# Patient Record
Sex: Female | Born: 1960 | Race: White | Hispanic: No | Marital: Single | Smoking: Never smoker
Health system: Southern US, Community
[De-identification: ages and names within clinical notes are randomized; demographics above are authoritative.]

## PROBLEM LIST (undated history)

## (undated) DIAGNOSIS — Z972 Presence of dental prosthetic device (complete) (partial): Secondary | ICD-10-CM

## (undated) DIAGNOSIS — S82843A Displaced bimalleolar fracture of unspecified lower leg, initial encounter for closed fracture: Secondary | ICD-10-CM

## (undated) DIAGNOSIS — E039 Hypothyroidism, unspecified: Secondary | ICD-10-CM

## (undated) DIAGNOSIS — S82842A Displaced bimalleolar fracture of left lower leg, initial encounter for closed fracture: Secondary | ICD-10-CM

## (undated) DIAGNOSIS — Z98811 Dental restoration status: Secondary | ICD-10-CM

## (undated) DIAGNOSIS — F419 Anxiety disorder, unspecified: Secondary | ICD-10-CM

## (undated) HISTORY — PX: LUMBAR DISC SURGERY: SHX700

---

## 1997-04-12 HISTORY — PX: ORIF CLAVICLE FRACTURE: SUR924

## 1998-03-18 ENCOUNTER — Other Ambulatory Visit: Admission: RE | Admit: 1998-03-18 | Discharge: 1998-03-18 | Payer: Self-pay | Admitting: Obstetrics and Gynecology

## 1999-05-20 ENCOUNTER — Other Ambulatory Visit: Admission: RE | Admit: 1999-05-20 | Discharge: 1999-05-20 | Payer: Self-pay | Admitting: Obstetrics and Gynecology

## 2001-07-04 ENCOUNTER — Other Ambulatory Visit: Admission: RE | Admit: 2001-07-04 | Discharge: 2001-07-04 | Payer: Self-pay | Admitting: Obstetrics and Gynecology

## 2002-09-04 ENCOUNTER — Other Ambulatory Visit: Admission: RE | Admit: 2002-09-04 | Discharge: 2002-09-04 | Payer: Self-pay | Admitting: Obstetrics and Gynecology

## 2003-09-27 ENCOUNTER — Other Ambulatory Visit: Admission: RE | Admit: 2003-09-27 | Discharge: 2003-09-27 | Payer: Self-pay | Admitting: Obstetrics and Gynecology

## 2004-12-23 ENCOUNTER — Other Ambulatory Visit: Admission: RE | Admit: 2004-12-23 | Discharge: 2004-12-23 | Payer: Self-pay | Admitting: Obstetrics and Gynecology

## 2006-01-17 ENCOUNTER — Ambulatory Visit: Payer: Self-pay | Admitting: Oncology

## 2014-06-10 ENCOUNTER — Other Ambulatory Visit: Payer: Self-pay | Admitting: Obstetrics and Gynecology

## 2014-06-10 DIAGNOSIS — R928 Other abnormal and inconclusive findings on diagnostic imaging of breast: Secondary | ICD-10-CM

## 2014-06-17 ENCOUNTER — Ambulatory Visit
Admission: RE | Admit: 2014-06-17 | Discharge: 2014-06-17 | Disposition: A | Discharge status: Home / Self Care | Payer: PRIVATE HEALTH INSURANCE | Source: Ambulatory Visit | Attending: Obstetrics and Gynecology | Admitting: Obstetrics and Gynecology

## 2014-06-17 DIAGNOSIS — R928 Other abnormal and inconclusive findings on diagnostic imaging of breast: Secondary | ICD-10-CM

## 2015-04-17 ENCOUNTER — Other Ambulatory Visit: Payer: Self-pay | Admitting: Orthopedic Surgery

## 2015-04-17 DIAGNOSIS — R079 Chest pain, unspecified: Secondary | ICD-10-CM

## 2015-04-21 ENCOUNTER — Ambulatory Visit
Admission: RE | Admit: 2015-04-21 | Discharge: 2015-04-21 | Disposition: A | Discharge status: Home / Self Care | Payer: PRIVATE HEALTH INSURANCE | Source: Ambulatory Visit | Attending: Orthopedic Surgery | Admitting: Orthopedic Surgery

## 2015-04-21 DIAGNOSIS — R079 Chest pain, unspecified: Secondary | ICD-10-CM

## 2015-04-21 MED ORDER — IOPAMIDOL (ISOVUE-300) INJECTION 61%
75.0000 mL | Freq: Once | INTRAVENOUS | Status: AC | PRN
  Administered 2015-04-21: 75 mL via INTRAVENOUS

## 2015-05-02 HISTORY — PX: CLAVICLE HARDWARE REMOVAL: SHX1351

## 2015-05-09 ENCOUNTER — Encounter (HOSPITAL_BASED_OUTPATIENT_CLINIC_OR_DEPARTMENT_OTHER): Payer: Self-pay | Admitting: Physician Assistant

## 2015-05-09 DIAGNOSIS — F419 Anxiety disorder, unspecified: Secondary | ICD-10-CM | POA: Diagnosis present

## 2015-05-09 DIAGNOSIS — E039 Hypothyroidism, unspecified: Secondary | ICD-10-CM | POA: Diagnosis present

## 2015-05-09 DIAGNOSIS — S42002K Fracture of unspecified part of left clavicle, subsequent encounter for fracture with nonunion: Secondary | ICD-10-CM | POA: Diagnosis present

## 2015-05-09 NOTE — H&P (Signed)
Deborah Sullivan is an 55 y.o. female.   Chief Complaint: left clavicle fracture non union HPI: Patient suffered a clavicle fracture 19 years ago.  It was managed by an outside orthopedist and developed a malunion.  She underwent takedown of the malunion and ORIF by Dr Thurston Hole in 1999.  She did well until 6-9 months ago when she developed a recurring wound over the lateral aspect of the plate.  She came in to see Dr Thurston Hole and January and had hardware removal on 05/02/2015.  She had no surgical complications.  At her post op visit on 05/08/2015 xrays showed displaced clavicle fracture mid shaft.  Patient does not recaull and sensation of the bone fracturing.  Past Medical History  Diagnosis Date  . 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 Hypothyroidism     Past Surgical History  Procedure Laterality Date  . Orif clavicle fracture Left 1999  . Clavicle hardware removal Left 05/02/2015    No family history on file. Social History:  reports that she has never smoked. She does not have any smokeless tobacco history on file. She reports that she does not drink alcohol. Her drug history is not on file.  Allergies: No Known Allergies  No current facility-administered medications for this encounter.  Current outpatient prescriptions:  .  levothyroxine (SYNTHROID, LEVOTHROID) 50 MCG tablet, Take 50 mcg by mouth daily before breakfast., Disp: , Rfl:  .  oxyCODONE-acetaminophen (PERCOCET/ROXICET) 5-325 MG tablet, Take by mouth every 4 (four) hours as needed for severe pain., Disp: , Rfl:  .  sertraline (ZOLOFT) 50 MG tablet, Take 50 mg by mouth daily., Disp: , Rfl:   No results found for this or any previous visit (from the past 48 hour(s)). No results found.  Review of Systems  Constitutional: Negative.   HENT: Negative.   Eyes: Negative.   Respiratory: Negative.   Cardiovascular: Negative.   Gastrointestinal: Negative.   Musculoskeletal:       Left clavicle fracture  Skin:  Negative.   Neurological: Negative.   Endo/Heme/Allergies: Negative.   Psychiatric/Behavioral: Negative.     There were no vitals taken for this visit. Physical Exam  Constitutional: She is oriented to person, place, and time. She appears well-developed and well-nourished.  HENT:  Head: Normocephalic and atraumatic.  Mouth/Throat: Oropharynx is clear and moist.  Eyes: Conjunctivae are normal. Pupils are equal, round, and reactive to light.  Neck: Neck supple.  Cardiovascular: Normal rate.   Respiratory: Effort normal.  GI: Soft.  Genitourinary:  Not pertinent to current symptomatology therefore not examined.  Musculoskeletal:  Left clavicle wound well approximated and healing.  Mild swelling.   No redness  Neurological: She is alert and oriented to person, place, and time.  Skin: Skin is warm and dry.  Psychiatric: She has a normal mood and affect. Her behavior is normal. Thought content normal.     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  Deborah Sullivan J 05/09/2015, 2:10 PM

## 2015-05-13 ENCOUNTER — Encounter (HOSPITAL_BASED_OUTPATIENT_CLINIC_OR_DEPARTMENT_OTHER)
Admission: RE | Disposition: A | Discharge status: Home / Self Care | Payer: Self-pay | Source: Ambulatory Visit | Attending: Orthopedic Surgery

## 2015-05-13 ENCOUNTER — Encounter (HOSPITAL_BASED_OUTPATIENT_CLINIC_OR_DEPARTMENT_OTHER): Payer: Self-pay | Admitting: *Deleted

## 2015-05-13 ENCOUNTER — Ambulatory Visit (HOSPITAL_BASED_OUTPATIENT_CLINIC_OR_DEPARTMENT_OTHER): Payer: PRIVATE HEALTH INSURANCE | Admitting: Anesthesiology

## 2015-05-13 ENCOUNTER — Ambulatory Visit (HOSPITAL_BASED_OUTPATIENT_CLINIC_OR_DEPARTMENT_OTHER)
Admission: RE | Admit: 2015-05-13 | Discharge: 2015-05-13 | Disposition: A | Discharge status: Home / Self Care | Payer: PRIVATE HEALTH INSURANCE | Source: Ambulatory Visit | Attending: Orthopedic Surgery | Admitting: Orthopedic Surgery

## 2015-05-13 DIAGNOSIS — F419 Anxiety disorder, unspecified: Secondary | ICD-10-CM | POA: Diagnosis present

## 2015-05-13 DIAGNOSIS — E039 Hypothyroidism, unspecified: Secondary | ICD-10-CM | POA: Diagnosis not present

## 2015-05-13 DIAGNOSIS — Z79899 Other long term (current) drug therapy: Secondary | ICD-10-CM | POA: Diagnosis not present

## 2015-05-13 DIAGNOSIS — M84412A Pathological fracture, left shoulder, initial encounter for fracture: Secondary | ICD-10-CM | POA: Diagnosis present

## 2015-05-13 DIAGNOSIS — S42002K Fracture of unspecified part of left clavicle, subsequent encounter for fracture with nonunion: Secondary | ICD-10-CM | POA: Diagnosis present

## 2015-05-13 HISTORY — DX: Anxiety disorder, unspecified: F41.9

## 2015-05-13 HISTORY — PX: ORIF CLAVICULAR FRACTURE: SHX5055

## 2015-05-13 HISTORY — DX: Hypothyroidism, unspecified: E03.9

## 2015-05-13 SURGERY — OPEN REDUCTION INTERNAL FIXATION (ORIF) CLAVICULAR FRACTURE
Anesthesia: General | Site: Shoulder | Laterality: Left

## 2015-05-13 MED ORDER — FENTANYL CITRATE (PF) 100 MCG/2ML IJ SOLN
INTRAMUSCULAR | Status: AC
  Filled 2015-05-13: qty 2

## 2015-05-13 MED ORDER — ONDANSETRON HCL 4 MG/2ML IJ SOLN
INTRAMUSCULAR | Status: DC | PRN
  Administered 2015-05-13: 4 mg via INTRAVENOUS

## 2015-05-13 MED ORDER — CEFAZOLIN SODIUM-DEXTROSE 2-3 GM-% IV SOLR
2.0000 g | INTRAVENOUS | Status: AC
  Administered 2015-05-13: 2 g via INTRAVENOUS

## 2015-05-13 MED ORDER — MIDAZOLAM HCL 2 MG/2ML IJ SOLN
INTRAMUSCULAR | Status: AC
  Filled 2015-05-13: qty 2

## 2015-05-13 MED ORDER — PROPOFOL 10 MG/ML IV BOLUS
INTRAVENOUS | Status: AC
  Filled 2015-05-13: qty 20

## 2015-05-13 MED ORDER — ONDANSETRON HCL 4 MG/2ML IJ SOLN
INTRAMUSCULAR | Status: AC
  Filled 2015-05-13: qty 2

## 2015-05-13 MED ORDER — CEFAZOLIN SODIUM-DEXTROSE 2-3 GM-% IV SOLR
INTRAVENOUS | Status: AC
  Filled 2015-05-13: qty 50

## 2015-05-13 MED ORDER — HYDROMORPHONE HCL 1 MG/ML IJ SOLN
INTRAMUSCULAR | Status: AC
  Filled 2015-05-13: qty 1

## 2015-05-13 MED ORDER — OXYCODONE-ACETAMINOPHEN 5-325 MG PO TABS: 1.0000 | ORAL_TABLET | ORAL | Status: DC | PRN

## 2015-05-13 MED ORDER — LACTATED RINGERS IV SOLN
INTRAVENOUS | Status: DC
  Administered 2015-05-13 (×3): via INTRAVENOUS

## 2015-05-13 MED ORDER — PROPOFOL 10 MG/ML IV BOLUS
INTRAVENOUS | Status: DC | PRN
  Administered 2015-05-13: 40 mg via INTRAVENOUS
  Administered 2015-05-13: 50 mg via INTRAVENOUS
  Administered 2015-05-13: 150 mg via INTRAVENOUS

## 2015-05-13 MED ORDER — SCOPOLAMINE 1 MG/3DAYS TD PT72: 1.0000 | MEDICATED_PATCH | Freq: Once | TRANSDERMAL | Status: DC | PRN

## 2015-05-13 MED ORDER — FENTANYL CITRATE (PF) 100 MCG/2ML IJ SOLN
50.0000 ug | INTRAMUSCULAR | Status: AC | PRN
  Administered 2015-05-13: 25 ug via INTRAVENOUS
  Administered 2015-05-13: 100 ug via INTRAVENOUS
  Administered 2015-05-13: 25 ug via INTRAVENOUS
  Administered 2015-05-13: 50 ug via INTRAVENOUS

## 2015-05-13 MED ORDER — DEXAMETHASONE SODIUM PHOSPHATE 10 MG/ML IJ SOLN
INTRAMUSCULAR | Status: AC
  Filled 2015-05-13: qty 1

## 2015-05-13 MED ORDER — SUCCINYLCHOLINE CHLORIDE 20 MG/ML IJ SOLN
INTRAMUSCULAR | Status: AC
  Filled 2015-05-13: qty 1

## 2015-05-13 MED ORDER — PROMETHAZINE HCL 25 MG/ML IJ SOLN: 6.2500 mg | INTRAMUSCULAR | Status: DC | PRN

## 2015-05-13 MED ORDER — DEXAMETHASONE SODIUM PHOSPHATE 4 MG/ML IJ SOLN
INTRAMUSCULAR | Status: DC | PRN
  Administered 2015-05-13: 10 mg via INTRAVENOUS

## 2015-05-13 MED ORDER — HYDROMORPHONE HCL 1 MG/ML IJ SOLN
0.2500 mg | INTRAMUSCULAR | Status: DC | PRN
  Administered 2015-05-13 (×4): 0.5 mg via INTRAVENOUS

## 2015-05-13 MED ORDER — LACTATED RINGERS IV SOLN: INTRAVENOUS | Status: DC

## 2015-05-13 MED ORDER — GLYCOPYRROLATE 0.2 MG/ML IJ SOLN
0.2000 mg | Freq: Once | INTRAMUSCULAR | Status: AC | PRN
  Administered 2015-05-13: 0.2 mg via INTRAVENOUS

## 2015-05-13 MED ORDER — SUCCINYLCHOLINE CHLORIDE 20 MG/ML IJ SOLN
INTRAMUSCULAR | Status: DC | PRN
  Administered 2015-05-13: 100 mg via INTRAVENOUS

## 2015-05-13 MED ORDER — MEPERIDINE HCL 25 MG/ML IJ SOLN: 6.2500 mg | INTRAMUSCULAR | Status: DC | PRN

## 2015-05-13 MED ORDER — OXYCODONE HCL 5 MG PO TABS
5.0000 mg | ORAL_TABLET | Freq: Once | ORAL | Status: AC | PRN
  Administered 2015-05-13: 5 mg via ORAL

## 2015-05-13 MED ORDER — LIDOCAINE HCL (CARDIAC) 20 MG/ML IV SOLN
INTRAVENOUS | Status: AC
  Filled 2015-05-13: qty 5

## 2015-05-13 MED ORDER — OXYCODONE HCL 5 MG PO TABS
ORAL_TABLET | ORAL | Status: AC
  Filled 2015-05-13: qty 1

## 2015-05-13 MED ORDER — MIDAZOLAM HCL 2 MG/2ML IJ SOLN
1.0000 mg | INTRAMUSCULAR | Status: DC | PRN
  Administered 2015-05-13: 2 mg via INTRAVENOUS

## 2015-05-13 MED ORDER — LIDOCAINE HCL (CARDIAC) 20 MG/ML IV SOLN
INTRAVENOUS | Status: DC | PRN
  Administered 2015-05-13: 50 mg via INTRAVENOUS

## 2015-05-13 MED ORDER — FENTANYL CITRATE (PF) 100 MCG/2ML IJ SOLN
INTRAMUSCULAR | Status: AC
Start: 2015-05-13 — End: 2015-05-13
  Filled 2015-05-13: qty 2

## 2015-05-13 MED ORDER — CHLORHEXIDINE GLUCONATE 4 % EX LIQD: 60.0000 mL | Freq: Once | CUTANEOUS | Status: DC

## 2015-05-13 SURGICAL SUPPLY — 80 items
BENZOIN TINCTURE PRP APPL 2/3 (GAUZE/BANDAGES/DRESSINGS) ×3 IMPLANT
BIT DRILL 2.8X5 QR DISP (BIT) ×3 IMPLANT
BLADE HEX COATED 2.75 (ELECTRODE) ×3 IMPLANT
BLADE SURG 15 STRL LF DISP TIS (BLADE) ×2 IMPLANT
BLADE SURG 15 STRL SS (BLADE) ×4
BNDG COHESIVE 4X5 TAN STRL (GAUZE/BANDAGES/DRESSINGS) ×3 IMPLANT
CANISTER SUCT 1200ML W/VALVE (MISCELLANEOUS) ×3 IMPLANT
CLOSURE WOUND 1/2 X4 (GAUZE/BANDAGES/DRESSINGS) ×1
COVER BACK TABLE 60X90IN (DRAPES) ×3 IMPLANT
COVER MAYO STAND STRL (DRAPES) ×3 IMPLANT
DECANTER SPIKE VIAL GLASS SM (MISCELLANEOUS) IMPLANT
DRAPE INCISE IOBAN 66X45 STRL (DRAPES) IMPLANT
DRAPE OEC MINIVIEW 54X84 (DRAPES) ×3 IMPLANT
DRAPE SHOULDER BEACH CHAIR (DRAPES) IMPLANT
DRAPE SURG 17X23 STRL (DRAPES) IMPLANT
DRAPE U 20/CS (DRAPES) ×6 IMPLANT
DRAPE U-SHAPE 47X51 STRL (DRAPES) ×6 IMPLANT
DRAPE U-SHAPE 76X120 STRL (DRAPES) ×6 IMPLANT
DRSG PAD ABDOMINAL 8X10 ST (GAUZE/BANDAGES/DRESSINGS) ×3 IMPLANT
DURAPREP 26ML APPLICATOR (WOUND CARE) ×3 IMPLANT
ELECT REM PT RETURN 9FT ADLT (ELECTROSURGICAL) ×3
ELECTRODE REM PT RTRN 9FT ADLT (ELECTROSURGICAL) ×1 IMPLANT
GAUZE SPONGE 4X4 12PLY STRL (GAUZE/BANDAGES/DRESSINGS) ×3 IMPLANT
GAUZE SPONGE 4X4 16PLY XRAY LF (GAUZE/BANDAGES/DRESSINGS) IMPLANT
GAUZE XEROFORM 1X8 LF (GAUZE/BANDAGES/DRESSINGS) IMPLANT
GLOVE BIO SURGEON STRL SZ 6.5 (GLOVE) ×2 IMPLANT
GLOVE BIO SURGEON STRL SZ7 (GLOVE) ×3 IMPLANT
GLOVE BIO SURGEONS STRL SZ 6.5 (GLOVE) ×1
GLOVE BIOGEL PI IND STRL 7.0 (GLOVE) ×3 IMPLANT
GLOVE BIOGEL PI IND STRL 7.5 (GLOVE) ×1 IMPLANT
GLOVE BIOGEL PI IND STRL 8 (GLOVE) ×1 IMPLANT
GLOVE BIOGEL PI INDICATOR 7.0 (GLOVE) ×6
GLOVE BIOGEL PI INDICATOR 7.5 (GLOVE) ×2
GLOVE BIOGEL PI INDICATOR 8 (GLOVE) ×2
GLOVE SS BIOGEL STRL SZ 7.5 (GLOVE) ×1 IMPLANT
GLOVE SS BIOGEL STRL SZ 8 (GLOVE) ×1 IMPLANT
GLOVE SUPERSENSE BIOGEL SZ 7.5 (GLOVE) ×2
GLOVE SUPERSENSE BIOGEL SZ 8 (GLOVE) ×2
GOWN STRL REUS W/ TWL LRG LVL3 (GOWN DISPOSABLE) ×3 IMPLANT
GOWN STRL REUS W/ TWL XL LVL3 (GOWN DISPOSABLE) ×1 IMPLANT
GOWN STRL REUS W/TWL LRG LVL3 (GOWN DISPOSABLE) ×6
GOWN STRL REUS W/TWL XL LVL3 (GOWN DISPOSABLE) ×2
IMPLANT MINI IGNITE KIT 4CC (Tissue) ×3 IMPLANT
NS IRRIG 1000ML POUR BTL (IV SOLUTION) ×3 IMPLANT
PACK BASIN DAY SURGERY FS (CUSTOM PROCEDURE TRAY) ×3 IMPLANT
PENCIL BUTTON HOLSTER BLD 10FT (ELECTRODE) ×3 IMPLANT
PLATE LOCKING 8H LEFT (Plate) ×3 IMPLANT
SCREW HEXALOBE LOCKING 3.5X14M (Screw) ×3 IMPLANT
SCREW HEXALOBE NON-LOCK 3.5X14 (Screw) ×3 IMPLANT
SCREW LOCK 12X3.5X HEXALOBE (Screw) ×2 IMPLANT
SCREW LOCKING 3.5X12 (Screw) ×4 IMPLANT
SCREW NON LOCK 3.5X10MM (Screw) ×3 IMPLANT
SCREW NONLOCK HEX 3.5X12 (Screw) ×6 IMPLANT
SHEET MEDIUM DRAPE 40X70 STRL (DRAPES) ×3 IMPLANT
SLEEVE SCD COMPRESS KNEE MED (MISCELLANEOUS) IMPLANT
SLING ARM FOAM STRAP LRG (SOFTGOODS) ×3 IMPLANT
SLING ARM IMMOBILIZER LRG (SOFTGOODS) IMPLANT
SLING ARM IMMOBILIZER MED (SOFTGOODS) IMPLANT
SLING ARM XL FOAM STRAP (SOFTGOODS) IMPLANT
SPONGE LAP 18X18 X RAY DECT (DISPOSABLE) IMPLANT
SPONGE LAP 4X18 X RAY DECT (DISPOSABLE) ×6 IMPLANT
STAPLER VISISTAT 35W (STAPLE) IMPLANT
STOCKINETTE IMPERVIOUS LG (DRAPES) ×3 IMPLANT
STRIP CLOSURE SKIN 1/2X4 (GAUZE/BANDAGES/DRESSINGS) ×2 IMPLANT
SUCTION FRAZIER HANDLE 10FR (MISCELLANEOUS) ×2
SUCTION TUBE FRAZIER 10FR DISP (MISCELLANEOUS) ×1 IMPLANT
SUT MNCRL AB 3-0 PS2 18 (SUTURE) ×3 IMPLANT
SUT PROLENE 3 0 PS 2 (SUTURE) IMPLANT
SUT SILK 4 0 TIES 17X18 (SUTURE) IMPLANT
SUT VIC AB 0 CT1 27 (SUTURE)
SUT VIC AB 0 CT1 27XBRD ANBCTR (SUTURE) IMPLANT
SUT VIC AB 2-0 PS2 27 (SUTURE) ×3 IMPLANT
SUT VIC AB 2-0 SH 27 (SUTURE)
SUT VIC AB 2-0 SH 27XBRD (SUTURE) IMPLANT
SUT VICRYL 0 UR6 27IN ABS (SUTURE) ×3 IMPLANT
SYR BULB 3OZ (MISCELLANEOUS) ×3 IMPLANT
TUBE CONNECTING 20'X1/4 (TUBING) ×1
TUBE CONNECTING 20X1/4 (TUBING) ×2 IMPLANT
UNDERPAD 30X30 (UNDERPADS AND DIAPERS) IMPLANT
YANKAUER SUCT BULB TIP NO VENT (SUCTIONS) ×3 IMPLANT

## 2015-05-13 NOTE — Anesthesia Postprocedure Evaluation (Signed)
Anesthesia Post Note  Patient: Deborah Sullivan  Procedure(s) Performed: Procedure(s) (LRB): OPEN REDUCTION INTERNAL FIXATION (ORIF) LEFT CLAVICULAR WITH INFUSE BONE GRAFT (Left)  Patient location during evaluation: PACU Anesthesia Type: General Level of consciousness: awake and alert Pain management: pain level controlled Vital Signs Assessment: post-procedure vital signs reviewed and stable Respiratory status: spontaneous breathing, nonlabored ventilation, respiratory function stable and patient connected to nasal cannula oxygen Cardiovascular status: blood pressure returned to baseline and stable Postop Assessment: no signs of nausea or vomiting Anesthetic complications: no    Last Vitals:  Filed Vitals:   05/13/15 1500 05/13/15 1515  BP: 140/91 129/83  Pulse: 60 75  Temp:    Resp: 9 13    Last Pain:  Filed Vitals:   05/13/15 1537  PainSc: 2                  Shelton Silvas

## 2015-05-13 NOTE — Transfer of Care (Signed)
Immediate Anesthesia Transfer of Care Note  Patient: Deborah Sullivan  Procedure(s) Performed: Procedure(s): OPEN REDUCTION INTERNAL FIXATION (ORIF) LEFT CLAVICULAR WITH INFUSE BONE GRAFT (Left)  Patient Location: PACU  Anesthesia Type:General  Level of Consciousness: awake, alert  and oriented  Airway & Oxygen Therapy: Patient Spontanous Breathing and Patient connected to face mask oxygen  Post-op Assessment: Report given to RN and Post -op Vital signs reviewed and stable  Post vital signs: Reviewed and stable  Last Vitals:  Filed Vitals:   05/13/15 1035  BP: 106/73  Pulse: 55  Temp: 36.6 C  Resp: 16    Complications: No apparent anesthesia complications

## 2015-05-13 NOTE — Anesthesia Preprocedure Evaluation (Addendum)
Anesthesia Evaluation  Patient identified by MRN, date of birth, ID band Patient awake    Reviewed: Allergy & Precautions, NPO status , Patient's Chart, lab work & pertinent test results  Airway Mallampati: I  TM Distance: >3 FB Neck ROM: Full    Dental  (+) Teeth Intact   Pulmonary neg pulmonary ROS,    breath sounds clear to auscultation       Cardiovascular negative cardio ROS   Rhythm:Regular Rate:Normal     Neuro/Psych PSYCHIATRIC DISORDERS Anxiety negative neurological ROS     GI/Hepatic negative GI ROS, Neg liver ROS,   Endo/Other  Hypothyroidism   Renal/GU negative Renal ROS  negative genitourinary   Musculoskeletal negative musculoskeletal ROS (+)   Abdominal   Peds negative pediatric ROS (+)  Hematology negative hematology ROS (+)   Anesthesia Other Findings   Reproductive/Obstetrics negative OB ROS                             Anesthesia Physical Anesthesia Plan  ASA: II  Anesthesia Plan: General   Post-op Pain Management:    Induction: Intravenous  Airway Management Planned: Oral ETT  Additional Equipment:   Intra-op Plan:   Post-operative Plan: Extubation in OR  Informed Consent: I have reviewed the patients History and Physical, chart, labs and discussed the procedure including the risks, benefits and alternatives for the proposed anesthesia with the patient or authorized representative who has indicated his/her understanding and acceptance.   Dental advisory given  Plan Discussed with: CRNA  Anesthesia Plan Comments:        Anesthesia Quick Evaluation

## 2015-05-13 NOTE — Op Note (Signed)
NAME:  Deborah Sullivan, Deborah Sullivan                  ACCOUNT NO.:  [id number]  MEDICAL RECORD NO.:  [id number]  LOCATION:                                 FACILITY:  PHYSICIAN:  Elana Alm. Thurston Hole, M.D. DATE OF BIRTH:  [birth year]  DATE OF PROCEDURE:  05/13/2015 DATE OF DISCHARGE:                              OPERATIVE REPORT   PREOPERATIVE DIAGNOSIS:  Left clavicle acute nontraumatic mid shaft displaced fracture 2 weeks status post hardware removal, left clavicle.  POSTOPERATIVE DIAGNOSIS:  Left clavicle acute nontraumatic mid shaft displaced fracture 2 weeks status post hardware removal, left clavicle.  PROCEDURE: 1. Open reduction and internal fixation of left clavicle fracture with     Acumed 8 hole plate. 2. Left clavicle bone grafting using a 9 Ignite bone graft.  SURGEON:  Elana Alm. Thurston Hole, M.D.  ASSISTANTS:  Myrene Galas, M.D. and Julien Girt, [place].  ANESTHESIA:  General.  OPERATIVE TIME:  1 hour.  COMPLICATIONS:  None.  INDICATION FOR PROCEDURE:  Deborah Sullivan is a 55 year old woman whom we had removed a painful hardware from her left clavicle 2 weeks ago.  That hardware had been in place for 20 years.  At the time of that hardware removal, there was no sign of a fracture, but on her first postoperative visit, she came in with a midshaft clavicle fracture and she is now to undergo primary open reduction and internal fixation of this with bone grafting.  DESCRIPTION:  Deborah Sullivan was brought to the operating room on May 13, 2015, placed on operating table in supine position.  After being placed under general anesthesia, her left shoulder and arm were prepped  when she was placed in a beach-chair position using sterile DuraPrep and draped using sterile technique.  A time-out procedure was called and the correct left clavicle identified.  She received antibiotics preoperatively for prophylaxis.  Through her previous incision, a new 6- cm incision was made.  Underlying subcutaneous  tissues were incised along with skin incision.  The previously placed suture material was removed, and the fracture was easily identified in the midshaft.  It did appear that this fracture had probably propagated through a previous screw hole.  No signs of obvious infection, however, we did culture the tissues.  After this was done, the wound was copiously irrigated. Neurovascular structures carefully protected while the fracture was reduced, and then an 8-hole Acumed superior plate was then placed.  The 4 most lateral screw holes were drilled, measured, tapped, and the appropriate length screws placed with 2 of them being locking screws and 2 being nonlocking screws.  The 3 most medial screw holes were drilled, measured, tapped with 1 piece of nonlocking screw and 2 being locking screws of appropriate length.  This gave excellent compression to the fracture and excellent anatomic reduction.  Intraoperative fluoroscopy confirmed anatomic position of the fracture and satisfactory position of the hardware.  At this point, the wound was irrigated and then the 4 mL of the Ignite bone graft which had been mixed with her own blood was then placed in the fracture site.  After this was done, then the deep fascia was closed with interrupted 2-0 Vicryl  suture over the plate and the clavicle and the bone graft.  Subcutaneous tissues closed with 2-0 Vicryl, subcuticular layer closed with 4-0 Monocryl.  Sterile dressings were applied.  Then, the patient awakened and taken to recovery room in stable condition.  Needle, sponge counts correct x2 at the end of the case.  FOLLOWUP CARE:  Deborah Sullivan to be followed as an outpatient on oxycodone for pain.  She will be back in the office in a week for wound check and followup.     Mysti Haley A. Thurston Hole, M.D.     RAW/MEDQ  D:  05/13/2015  T:  05/13/2015  Job:  [id number]

## 2015-05-13 NOTE — Anesthesia Procedure Notes (Signed)
Procedure Name: Intubation Date/Time: 05/13/2015 12:36 PM Performed by: Gar Gibbon Pre-anesthesia Checklist: Patient identified, Emergency Drugs available, Suction available and Patient being monitored Patient Re-evaluated:Patient Re-evaluated prior to inductionOxygen Delivery Method: Circle System Utilized Preoxygenation: Pre-oxygenation with 100% oxygen Intubation Type: IV induction Ventilation: Mask ventilation without difficulty Laryngoscope Size: Mac and 3 Grade View: Grade I Tube type: Oral Tube size: 7.0 mm Number of attempts: 1 Airway Equipment and Method: Stylet and Oral airway Placement Confirmation: ETT inserted through vocal cords under direct vision,  positive ETCO2 and breath sounds checked- equal and bilateral Secured at: 20 cm Tube secured with: Tape Dental Injury: Teeth and Oropharynx as per pre-operative assessment

## 2015-05-13 NOTE — Discharge Instructions (Signed)

## 2015-05-13 NOTE — Interval H&P Note (Signed)
History and Physical Interval Note:  05/13/2015 7:41 AM  Deborah Sullivan  has presented today for surgery, with the diagnosis of FRACTURE OF UNSPECIFIED PART OF UNSPECIFIED CLAVICLE,INITAL ENCOUNTER FOR CLOSED FRACTURE   The various methods of treatment have been discussed with the patient and family. After consideration of risks, benefits and other options for treatment, the patient has consented to  Have open reduction internal fixation of her clavicle with bone graft as a surgical intervention .  The patient's history has been reviewed, patient examined, no change in status, stable for surgery.  I have reviewed the patient's chart and labs.  Questions were answered to the patient's satisfaction.     Salvatore Marvel A

## 2015-05-14 ENCOUNTER — Encounter (HOSPITAL_BASED_OUTPATIENT_CLINIC_OR_DEPARTMENT_OTHER): Payer: Self-pay | Admitting: Orthopedic Surgery

## 2015-05-16 LAB — WOUND CULTURE
CULTURE: NO GROWTH
GRAM STAIN: NONE SEEN

## 2015-05-18 LAB — ANAEROBIC CULTURE: Gram Stain: NONE SEEN

## 2017-04-29 ENCOUNTER — Other Ambulatory Visit: Payer: Self-pay

## 2017-04-29 ENCOUNTER — Encounter (HOSPITAL_BASED_OUTPATIENT_CLINIC_OR_DEPARTMENT_OTHER): Payer: Self-pay | Admitting: *Deleted

## 2017-04-29 ENCOUNTER — Other Ambulatory Visit: Payer: Self-pay | Admitting: Orthopedic Surgery

## 2017-04-29 DIAGNOSIS — S82843A Displaced bimalleolar fracture of unspecified lower leg, initial encounter for closed fracture: Secondary | ICD-10-CM

## 2017-04-29 HISTORY — DX: Displaced bimalleolar fracture of unspecified lower leg, initial encounter for closed fracture: S82.843A

## 2017-05-03 ENCOUNTER — Ambulatory Visit (HOSPITAL_BASED_OUTPATIENT_CLINIC_OR_DEPARTMENT_OTHER): Payer: BLUE CROSS/BLUE SHIELD | Admitting: Anesthesiology

## 2017-05-03 ENCOUNTER — Ambulatory Visit (HOSPITAL_BASED_OUTPATIENT_CLINIC_OR_DEPARTMENT_OTHER)
Admission: RE | Admit: 2017-05-03 | Discharge: 2017-05-03 | Disposition: A | Discharge status: Home / Self Care | Payer: BLUE CROSS/BLUE SHIELD | Source: Ambulatory Visit | Attending: Orthopedic Surgery | Admitting: Orthopedic Surgery

## 2017-05-03 ENCOUNTER — Encounter (HOSPITAL_BASED_OUTPATIENT_CLINIC_OR_DEPARTMENT_OTHER)
Admission: RE | Disposition: A | Discharge status: Home / Self Care | Payer: Self-pay | Source: Ambulatory Visit | Attending: Orthopedic Surgery

## 2017-05-03 ENCOUNTER — Other Ambulatory Visit: Payer: Self-pay

## 2017-05-03 ENCOUNTER — Encounter (HOSPITAL_BASED_OUTPATIENT_CLINIC_OR_DEPARTMENT_OTHER): Payer: Self-pay

## 2017-05-03 DIAGNOSIS — X58XXXA Exposure to other specified factors, initial encounter: Secondary | ICD-10-CM | POA: Diagnosis not present

## 2017-05-03 DIAGNOSIS — E039 Hypothyroidism, unspecified: Secondary | ICD-10-CM | POA: Insufficient documentation

## 2017-05-03 DIAGNOSIS — F419 Anxiety disorder, unspecified: Secondary | ICD-10-CM | POA: Diagnosis not present

## 2017-05-03 DIAGNOSIS — Z79890 Hormone replacement therapy (postmenopausal): Secondary | ICD-10-CM | POA: Insufficient documentation

## 2017-05-03 DIAGNOSIS — S82842A Displaced bimalleolar fracture of left lower leg, initial encounter for closed fracture: Secondary | ICD-10-CM | POA: Diagnosis present

## 2017-05-03 DIAGNOSIS — Z79899 Other long term (current) drug therapy: Secondary | ICD-10-CM | POA: Insufficient documentation

## 2017-05-03 HISTORY — DX: Displaced bimalleolar fracture of left lower leg, initial encounter for closed fracture: S82.842A

## 2017-05-03 HISTORY — DX: Displaced bimalleolar fracture of unspecified lower leg, initial encounter for closed fracture: S82.843A

## 2017-05-03 HISTORY — DX: Presence of dental prosthetic device (complete) (partial): Z97.2

## 2017-05-03 HISTORY — PX: ORIF ANKLE FRACTURE: SHX5408

## 2017-05-03 HISTORY — DX: Dental restoration status: Z98.811

## 2017-05-03 SURGERY — OPEN REDUCTION INTERNAL FIXATION (ORIF) ANKLE FRACTURE
Anesthesia: General | Site: Ankle | Laterality: Left

## 2017-05-03 MED ORDER — DEXAMETHASONE SODIUM PHOSPHATE 10 MG/ML IJ SOLN
INTRAMUSCULAR | Status: AC
  Filled 2017-05-03: qty 1

## 2017-05-03 MED ORDER — 0.9 % SODIUM CHLORIDE (POUR BTL) OPTIME
TOPICAL | Status: DC | PRN
  Administered 2017-05-03: 1000 mL

## 2017-05-03 MED ORDER — CEFAZOLIN SODIUM-DEXTROSE 2-4 GM/100ML-% IV SOLN
2.0000 g | INTRAVENOUS | Status: AC
  Administered 2017-05-03: 2 g via INTRAVENOUS

## 2017-05-03 MED ORDER — GLYCOPYRROLATE 0.2 MG/ML IJ SOLN
INTRAMUSCULAR | Status: DC | PRN
  Administered 2017-05-03: 0.1 mg via INTRAVENOUS

## 2017-05-03 MED ORDER — PROPOFOL 10 MG/ML IV BOLUS
INTRAVENOUS | Status: AC
  Filled 2017-05-03: qty 20

## 2017-05-03 MED ORDER — ONDANSETRON HCL 4 MG PO TABS: 4.0000 mg | ORAL_TABLET | Freq: Three times a day (TID) | ORAL | 0 refills | Status: AC | PRN

## 2017-05-03 MED ORDER — PROPOFOL 10 MG/ML IV BOLUS
INTRAVENOUS | Status: DC | PRN
  Administered 2017-05-03: 150 mg via INTRAVENOUS
  Administered 2017-05-03 (×2): 50 mg via INTRAVENOUS

## 2017-05-03 MED ORDER — LIDOCAINE HCL (CARDIAC) 20 MG/ML IV SOLN
INTRAVENOUS | Status: DC | PRN
  Administered 2017-05-03: 30 mg via INTRAVENOUS

## 2017-05-03 MED ORDER — SENNA-DOCUSATE SODIUM 8.6-50 MG PO TABS: 2.0000 | ORAL_TABLET | Freq: Every day | ORAL | 1 refills | Status: AC

## 2017-05-03 MED ORDER — MIDAZOLAM HCL 2 MG/2ML IJ SOLN
INTRAMUSCULAR | Status: AC
  Filled 2017-05-03: qty 2

## 2017-05-03 MED ORDER — DEXAMETHASONE SODIUM PHOSPHATE 4 MG/ML IJ SOLN
INTRAMUSCULAR | Status: DC | PRN
  Administered 2017-05-03: 10 mg via INTRAVENOUS

## 2017-05-03 MED ORDER — MEPERIDINE HCL 25 MG/ML IJ SOLN: 6.2500 mg | INTRAMUSCULAR | Status: DC | PRN

## 2017-05-03 MED ORDER — PROPOFOL 500 MG/50ML IV EMUL
INTRAVENOUS | Status: AC
  Filled 2017-05-03: qty 50

## 2017-05-03 MED ORDER — ONDANSETRON HCL 4 MG/2ML IJ SOLN
INTRAMUSCULAR | Status: DC | PRN
  Administered 2017-05-03: 4 mg via INTRAVENOUS

## 2017-05-03 MED ORDER — ROPIVACAINE HCL 7.5 MG/ML IJ SOLN
INTRAMUSCULAR | Status: DC | PRN
  Administered 2017-05-03: 20 mL via PERINEURAL

## 2017-05-03 MED ORDER — LIDOCAINE 2% (20 MG/ML) 5 ML SYRINGE
INTRAMUSCULAR | Status: AC
  Filled 2017-05-03: qty 5

## 2017-05-03 MED ORDER — FENTANYL CITRATE (PF) 100 MCG/2ML IJ SOLN
INTRAMUSCULAR | Status: DC | PRN
  Administered 2017-05-03: 100 ug via INTRAVENOUS

## 2017-05-03 MED ORDER — EPHEDRINE 5 MG/ML INJ
INTRAVENOUS | Status: AC
  Filled 2017-05-03: qty 10

## 2017-05-03 MED ORDER — LACTATED RINGERS IV SOLN
INTRAVENOUS | Status: DC
  Administered 2017-05-03 (×2): via INTRAVENOUS

## 2017-05-03 MED ORDER — BACLOFEN 10 MG PO TABS: 10.0000 mg | ORAL_TABLET | Freq: Three times a day (TID) | ORAL | 0 refills | Status: AC

## 2017-05-03 MED ORDER — FENTANYL CITRATE (PF) 100 MCG/2ML IJ SOLN
INTRAMUSCULAR | Status: AC
  Filled 2017-05-03: qty 2

## 2017-05-03 MED ORDER — FENTANYL CITRATE (PF) 100 MCG/2ML IJ SOLN: 25.0000 ug | INTRAMUSCULAR | Status: DC | PRN

## 2017-05-03 MED ORDER — BUPIVACAINE HCL (PF) 0.5 % IJ SOLN
INTRAMUSCULAR | Status: AC
  Filled 2017-05-03: qty 30

## 2017-05-03 MED ORDER — HYDROCODONE-ACETAMINOPHEN 7.5-325 MG PO TABS: 1.0000 | ORAL_TABLET | Freq: Once | ORAL | Status: DC | PRN

## 2017-05-03 MED ORDER — CEFAZOLIN SODIUM-DEXTROSE 2-4 GM/100ML-% IV SOLN
INTRAVENOUS | Status: AC
  Filled 2017-05-03: qty 100

## 2017-05-03 MED ORDER — ROPIVACAINE HCL 5 MG/ML IJ SOLN
INTRAMUSCULAR | Status: DC | PRN
  Administered 2017-05-03: 30 mL via PERINEURAL

## 2017-05-03 MED ORDER — FENTANYL CITRATE (PF) 100 MCG/2ML IJ SOLN
50.0000 ug | INTRAMUSCULAR | Status: DC | PRN
  Administered 2017-05-03: 50 ug via INTRAVENOUS

## 2017-05-03 MED ORDER — EPHEDRINE SULFATE 50 MG/ML IJ SOLN
INTRAMUSCULAR | Status: DC | PRN
  Administered 2017-05-03 (×2): 10 mg via INTRAVENOUS

## 2017-05-03 MED ORDER — SCOPOLAMINE 1 MG/3DAYS TD PT72: 1.0000 | MEDICATED_PATCH | Freq: Once | TRANSDERMAL | Status: DC | PRN

## 2017-05-03 MED ORDER — METOCLOPRAMIDE HCL 5 MG/ML IJ SOLN: 10.0000 mg | Freq: Once | INTRAMUSCULAR | Status: DC | PRN

## 2017-05-03 MED ORDER — MIDAZOLAM HCL 5 MG/5ML IJ SOLN
INTRAMUSCULAR | Status: DC | PRN
  Administered 2017-05-03: 2 mg via INTRAVENOUS

## 2017-05-03 MED ORDER — HYDROCODONE-ACETAMINOPHEN 10-325 MG PO TABS: 1.0000 | ORAL_TABLET | ORAL | 0 refills | Status: AC | PRN

## 2017-05-03 MED ORDER — MIDAZOLAM HCL 2 MG/2ML IJ SOLN
1.0000 mg | INTRAMUSCULAR | Status: DC | PRN
  Administered 2017-05-03: 2 mg via INTRAVENOUS

## 2017-05-03 MED ORDER — CHLORHEXIDINE GLUCONATE 4 % EX LIQD: 60.0000 mL | Freq: Once | CUTANEOUS | Status: DC

## 2017-05-03 MED ORDER — ONDANSETRON HCL 4 MG/2ML IJ SOLN
INTRAMUSCULAR | Status: AC
  Filled 2017-05-03: qty 2

## 2017-05-03 SURGICAL SUPPLY — 72 items
BANDAGE ACE 4X5 VEL STRL LF (GAUZE/BANDAGES/DRESSINGS) ×3 IMPLANT
BANDAGE ACE 6X5 VEL STRL LF (GAUZE/BANDAGES/DRESSINGS) ×3 IMPLANT
BANDAGE ESMARK 6X9 LF (GAUZE/BANDAGES/DRESSINGS) ×1 IMPLANT
BIT DRILL 110X2.5XQCK CNCT (BIT) ×1 IMPLANT
BIT DRILL 2.5 (BIT) ×2
BIT DRL 110X2.5XQCK CNCT (BIT) ×1
BLADE SURG 15 STRL LF DISP TIS (BLADE) ×3 IMPLANT
BLADE SURG 15 STRL SS (BLADE) ×6
BNDG COHESIVE 4X5 TAN STRL (GAUZE/BANDAGES/DRESSINGS) ×3 IMPLANT
BNDG ESMARK 6X9 LF (GAUZE/BANDAGES/DRESSINGS) ×3
BNDG PLASTER X FAST 5X5 WHT LF (CAST SUPPLIES) ×3 IMPLANT
CANISTER SUCT 1200ML W/VALVE (MISCELLANEOUS) ×3 IMPLANT
CLOSURE STERI-STRIP 1/2X4 (GAUZE/BANDAGES/DRESSINGS)
CLSR STERI-STRIP ANTIMIC 1/2X4 (GAUZE/BANDAGES/DRESSINGS) IMPLANT
COVER BACK TABLE 60X90IN (DRAPES) ×3 IMPLANT
CUFF TOURNIQUET SINGLE 34IN LL (TOURNIQUET CUFF) ×3 IMPLANT
DECANTER SPIKE VIAL GLASS SM (MISCELLANEOUS) IMPLANT
DRAPE C-ARM 42X72 X-RAY (DRAPES) IMPLANT
DRAPE C-ARMOR (DRAPES) IMPLANT
DRAPE EXTREMITY T 121X128X90 (DRAPE) ×3 IMPLANT
DRAPE IMP U-DRAPE 54X76 (DRAPES) ×3 IMPLANT
DRAPE INCISE IOBAN 66X45 STRL (DRAPES) ×3 IMPLANT
DRAPE OEC MINIVIEW 54X84 (DRAPES) IMPLANT
DRAPE U-SHAPE 47X51 STRL (DRAPES) ×3 IMPLANT
DRSG ADAPTIC 3X8 NADH LF (GAUZE/BANDAGES/DRESSINGS) ×3 IMPLANT
DRSG PAD ABDOMINAL 8X10 ST (GAUZE/BANDAGES/DRESSINGS) ×6 IMPLANT
DURAPREP 26ML APPLICATOR (WOUND CARE) ×3 IMPLANT
ELECT REM PT RETURN 9FT ADLT (ELECTROSURGICAL) ×3
ELECTRODE REM PT RTRN 9FT ADLT (ELECTROSURGICAL) ×1 IMPLANT
GAUZE SPONGE 4X4 12PLY STRL (GAUZE/BANDAGES/DRESSINGS) ×3 IMPLANT
GLOVE BIO SURGEON STRL SZ8 (GLOVE) ×3 IMPLANT
GLOVE BIOGEL PI IND STRL 8 (GLOVE) ×2 IMPLANT
GLOVE BIOGEL PI INDICATOR 8 (GLOVE) ×4
GLOVE ORTHO TXT STRL SZ7.5 (GLOVE) ×3 IMPLANT
GOWN STRL REUS W/ TWL LRG LVL3 (GOWN DISPOSABLE) ×1 IMPLANT
GOWN STRL REUS W/ TWL XL LVL3 (GOWN DISPOSABLE) ×2 IMPLANT
GOWN STRL REUS W/TWL LRG LVL3 (GOWN DISPOSABLE) ×2
GOWN STRL REUS W/TWL XL LVL3 (GOWN DISPOSABLE) ×4
NEEDLE HYPO 25X1 1.5 SAFETY (NEEDLE) IMPLANT
NS IRRIG 1000ML POUR BTL (IV SOLUTION) ×3 IMPLANT
PACK BASIN DAY SURGERY FS (CUSTOM PROCEDURE TRAY) ×3 IMPLANT
PAD CAST 4YDX4 CTTN HI CHSV (CAST SUPPLIES) ×1 IMPLANT
PADDING CAST COTTON 4X4 STRL (CAST SUPPLIES) ×2
PADDING CAST COTTON 6X4 STRL (CAST SUPPLIES) ×3 IMPLANT
PENCIL BUTTON HOLSTER BLD 10FT (ELECTRODE) ×3 IMPLANT
PLATE 6HOLE 1/3 TUBULAR (Plate) ×3 IMPLANT
SCREW CANC 2.5XFT 16X4XST SM (Screw) ×2 IMPLANT
SCREW CANC 4.0X16 (Screw) ×4 IMPLANT
SCREW CORTICAL 3.5 16MM (Screw) ×9 IMPLANT
SHEET MEDIUM DRAPE 40X70 STRL (DRAPES) IMPLANT
SLEEVE SCD COMPRESS KNEE MED (MISCELLANEOUS) ×3 IMPLANT
SPLINT FAST PLASTER 5X30 (CAST SUPPLIES)
SPLINT PLASTER CAST FAST 5X30 (CAST SUPPLIES) IMPLANT
SPONGE LAP 4X18 X RAY DECT (DISPOSABLE) ×3 IMPLANT
STAPLER VISISTAT 35W (STAPLE) IMPLANT
SUCTION FRAZIER HANDLE 10FR (MISCELLANEOUS) ×2
SUCTION TUBE FRAZIER 10FR DISP (MISCELLANEOUS) ×1 IMPLANT
SUT ETHILON 3 0 PS 1 (SUTURE) IMPLANT
SUT ETHILON 4 0 PS 2 18 (SUTURE) IMPLANT
SUT MNCRL AB 4-0 PS2 18 (SUTURE) IMPLANT
SUT VIC AB 0 CT1 27 (SUTURE)
SUT VIC AB 0 CT1 27XBRD ANBCTR (SUTURE) IMPLANT
SUT VIC AB 2-0 SH 18 (SUTURE) IMPLANT
SUT VIC AB 3-0 SH 27 (SUTURE) ×2
SUT VIC AB 3-0 SH 27X BRD (SUTURE) ×1 IMPLANT
SUT VICRYL 3-0 CR8 SH (SUTURE) ×3 IMPLANT
SYR BULB 3OZ (MISCELLANEOUS) ×3 IMPLANT
SYR CONTROL 10ML LL (SYRINGE) IMPLANT
TUBE CONNECTING 20'X1/4 (TUBING) ×1
TUBE CONNECTING 20X1/4 (TUBING) ×2 IMPLANT
UNDERPAD 30X30 (UNDERPADS AND DIAPERS) ×3 IMPLANT
YANKAUER SUCT BULB TIP NO VENT (SUCTIONS) ×3 IMPLANT

## 2017-05-03 NOTE — Anesthesia Procedure Notes (Signed)
Procedure Name: LMA Insertion Date/Time: 05/03/2017 10:14 AM Performed by: [place] DesanctisLinka, Jniyah Dantuono L, CRNA Pre-anesthesia Checklist: Patient identified, Emergency Drugs available, Suction available, Patient being monitored and Timeout performed Patient Re-evaluated:Patient Re-evaluated prior to induction Oxygen Delivery Method: Circle system utilized Preoxygenation: Pre-oxygenation with 100% oxygen Induction Type: IV induction Ventilation: Mask ventilation without difficulty LMA: LMA inserted LMA Size: 4.0 Number of attempts: 1 Airway Equipment and Method: Bite block Placement Confirmation: positive ETCO2 Tube secured with: Tape Dental Injury: Teeth and Oropharynx as per pre-operative assessment

## 2017-05-03 NOTE — Discharge Instructions (Signed)

## 2017-05-03 NOTE — Anesthesia Preprocedure Evaluation (Signed)
Anesthesia Evaluation  Patient identified by MRN, date of birth, ID band Patient awake    Reviewed: Allergy & Precautions, NPO status , Patient's Chart, lab work & pertinent test results  Airway Mallampati: II  TM Distance: >3 FB Neck ROM: Full    Dental no notable dental hx. (+) Caps   Pulmonary neg pulmonary ROS,    Pulmonary exam normal breath sounds clear to auscultation       Cardiovascular negative cardio ROS Normal cardiovascular exam Rhythm:Regular Rate:Normal     Neuro/Psych negative neurological ROS  negative psych ROS   GI/Hepatic negative GI ROS, Neg liver ROS,   Endo/Other  Hypothyroidism   Renal/GU negative Renal ROS  negative genitourinary   Musculoskeletal Bimalleolar Fx Left ankle   Abdominal   Peds  Hematology negative hematology ROS (+)   Anesthesia Other Findings   Reproductive/Obstetrics                             Anesthesia Physical Anesthesia Plan  ASA: II  Anesthesia Plan: General   Post-op Pain Management:  Regional for Post-op pain   Induction: Intravenous  PONV Risk Score and Plan: 3  Airway Management Planned: LMA  Additional Equipment:   Intra-op Plan:   Post-operative Plan: Extubation in OR  Informed Consent: I have reviewed the patients History and Physical, chart, labs and discussed the procedure including the risks, benefits and alternatives for the proposed anesthesia with the patient or authorized representative who has indicated his/her understanding and acceptance.   Dental advisory given  Plan Discussed with: CRNA, Anesthesiologist and Surgeon  Anesthesia Plan Comments:         Anesthesia Quick Evaluation

## 2017-05-03 NOTE — Op Note (Addendum)
05/03/2017  PATIENT:  Deborah Sullivan    PRE-OPERATIVE DIAGNOSIS: Left bimalleolar ankle fracture  POST-OPERATIVE DIAGNOSIS:  Same  PROCEDURE: Open reduction internal fixation left bimalleolar ankle fracture with fixation of the distal fibula only            SURGEON:  Eulas PostJoshua P Nioka Thorington, MD  PHYSICIAN ASSISTANT: Janace LittenBrandon Parry, OPA-C, present and scrubbed throughout the case, critical for completion in a timely fashion, and for retraction, instrumentation, and closure.  ANESTHESIA:   General  ESTIMATED BLOOD LOSS: Minimal  PREOPERATIVE INDICATIONS:  Deborah Sullivan is a  57 y.o. female with a diagnosis of who had a left bimalleolar ankle fracture with a small avulsion off of the medial malleolus, but significant displacement of the mortise and the distal fibula who elected for surgical management to minimize the risk for malunion and nonunion and post-traumatic arthritis.    The risks benefits and alternatives were discussed with the patient preoperatively including but not limited to the risks of infection, bleeding, nerve injury, cardiopulmonary complications, the need for revision surgery, the need for hardware removal, among others, and the patient was willing to proceed.  OPERATIVE IMPLANTS: Biomet / Zimmer 1/3 tubular plate size 6 hole with 2 distal cancellous screws and 3 proximal cortical screws.  Unique aspects of the case: The distal fibula had a fairly low fracture, and it was actually comminuted, with an anterior segment that was outside of the plate, which was cracked into 2 pieces, not amenable to auxiliary fixation.  Additionally, because of this third segment, the fracture was not amenable to lag screw.  Bone quality was mediocre.  I debated using a locking plate, but was able to get good position with the 2 distal cancellus screws, and good fixation proximally, and I assess the medial malleolus piece, however it appeared stable, very small, and anatomically reduced.  It was  more of an avulsion of the anterior deltoid, and did not require augmentation or additional fixation.  The syndesmosis was stressed and is stable.  OPERATIVE PROCEDURE: The patient was brought to the operating room and placed in the supine position. All bony prominences were padded. General anesthesia was administered. The lower extremity was prepped and draped in the usual sterile fashion. The leg was elevated and exsanguinated and the tourniquet was inflated. Time out was performed.   Incision was made over the distal fibula and the fracture was exposed and reduced anatomically with a clamp.  I kept the clamp in place while I applied the plate, because a lag screw was not possible given the anterior comminution.  I then applied a 1/3 tubular plate and secured it proximally and distally non-locking screws. Bone quality was mediocre. I used c-arm to confirm satisfactory reduction and fixation.   3 views of the left ankle were taken which demonstrated anatomic alignment status post ORIF.  Syndesmotic stress view was taken using live fluoroscopy and found to be stable.    The wounds were irrigated, and closed with vicryl with routine closure for the skin. The wounds were injected with local anesthetic. Sterile gauze was applied followed by a posterior splint. She was awakened and returned to the PACU in stable and satisfactory condition. There were no complications.

## 2017-05-03 NOTE — Transfer of Care (Signed)
Immediate Anesthesia Transfer of Care Note  Patient: Deborah Sullivan  Procedure(s) Performed: OPEN REDUCTION INTERNAL FIXATION (ORIF)  BILMALLEOLAR ANKLE FRACTURE (Left Ankle)  Patient Location: PACU  Anesthesia Type:GA combined with regional for post-op pain  Level of Consciousness: awake and patient cooperative  Airway & Oxygen Therapy: Patient Spontanous Breathing, Patient connected to face mask oxygen and Patient connected to T-piece oxygen  Post-op Assessment: Report given to RN and Post -op Vital signs reviewed and stable  Post vital signs: Reviewed and stable  Last Vitals:  Vitals:   05/03/17 0950 05/03/17 0955  BP:  110/70  Pulse: 60 62  Resp: 12 17  Temp:    SpO2: 100% 100%    Last Pain:  Vitals:   05/03/17 0856  TempSrc: Oral  PainSc: 2          Complications: No apparent anesthesia complications

## 2017-05-03 NOTE — Anesthesia Postprocedure Evaluation (Signed)
Anesthesia Post Note  Patient: Deborah Sullivan  Procedure(s) Performed: OPEN REDUCTION INTERNAL FIXATION (ORIF)  BILMALLEOLAR ANKLE FRACTURE (Left Ankle)     Patient location during evaluation: PACU Anesthesia Type: General Level of consciousness: awake and alert and oriented Pain management: pain level controlled Vital Signs Assessment: post-procedure vital signs reviewed and stable Respiratory status: spontaneous breathing, nonlabored ventilation and respiratory function stable Cardiovascular status: blood pressure returned to baseline and stable Postop Assessment: no apparent nausea or vomiting Anesthetic complications: no    Last Vitals:  Vitals:   05/03/17 1130 05/03/17 1208  BP: 111/73 118/74  Pulse: 81 80  Resp: 13 18  Temp:  36.5 C  SpO2: 100% 99%    Last Pain:  Vitals:   05/03/17 1208  TempSrc:   PainSc: 0-No pain                 Zosia Lucchese A.

## 2017-05-03 NOTE — H&P (Signed)
PREOPERATIVE H&P  Chief Complaint: LEFT ANKLE FRACTURE DISPLACED BIMALLEOLAR LOWER LEG  HPI: Deborah Sullivan is a 57 y.o. female who presents for preoperative history and physical with a diagnosis of LEFT ANKLE FRACTURE DISPLACED BIMALLEOLAR LOWER LEG. Symptoms are rated as moderate to severe, and have been worsening.  This is significantly impairing activities of daily living.  She has elected for surgical management.   Past Medical History:  Diagnosis Date  . Anxiety   . Bimalleolar ankle fracture 04/29/2017   left  . Dental bridge present    upper x 2  . Dental crowns present   . Hypothyroidism    Past Surgical History:  Procedure Laterality Date  . CLAVICLE HARDWARE REMOVAL Left 05/02/2015  . LUMBAR DISC SURGERY    . ORIF CLAVICLE FRACTURE Left 1999  . ORIF CLAVICULAR FRACTURE Left 05/13/2015   Procedure: OPEN REDUCTION INTERNAL FIXATION (ORIF) LEFT CLAVICULAR WITH INFUSE BONE GRAFT;  Surgeon: Salvatore Marvel, MD;  Location: [facility];  Service: Orthopedics;  Laterality: Left;   Social History   Socioeconomic History  . Marital status: Single    Spouse name: None  . Number of children: None  . Years of education: None  . Highest education level: None  Social Needs  . Financial resource strain: None  . Food insecurity - worry: None  . Food insecurity - inability: None  . Transportation needs - medical: None  . Transportation needs - non-medical: None  Occupational History  . Occupation: speech therapist  Tobacco Use  . Smoking status: Never Smoker  . Smokeless tobacco: Never Used  Substance and Sexual Activity  . Alcohol use: Yes    Alcohol/week: 0.0 oz    Comment: 3 x/week  . Drug use: No  . Sexual activity: None  Other Topics Concern  . 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 No pertinent family history. No Known Allergies Prior to Admission medications   Medication Sig Start Date  End Date Taking? Authorizing Provider  acetaminophen (TYLENOL) 325 MG tablet Take 650 mg by mouth every 6 (six) hours as needed.   Yes [provider]  calcium carbonate 1250 MG capsule Take 1,250 mg by mouth once a week. [facility]   Yes [provider]  Calcium Carbonate Antacid 600 MG chewable tablet Chew 600 mg by mouth.   Yes [provider]  levothyroxine (SYNTHROID, LEVOTHROID) 50 MCG tablet Take 50 mcg by mouth daily before breakfast.   Yes [provider]  Multiple Vitamin (MULTIVITAMIN) tablet Take 1 tablet by mouth daily.   Yes [provider]  sertraline (ZOLOFT) 50 MG tablet Take 50 mg by mouth daily.   Yes [provider]     Positive ROS: All other systems have been reviewed and were otherwise negative with the exception of those mentioned in the HPI and as above.  Physical Exam: General: Alert, no acute distress Cardiovascular: No pedal edema Respiratory: No cyanosis, no use of accessory musculature GI: No organomegaly, abdomen is soft and non-tender Skin: No lesions in the area of chief complaint Neurologic: Sensation intact distally Psychiatric: Patient is competent for consent with normal mood and affect Lymphatic: No axillary or cervical lymphadenopathy  MUSCULOSKELETAL: Left ankle has positive pain to palpation medially and laterally with positive ecchymosis and mild deformity.  Assessment: LEFT ANKLE FRACTURE DISPLACED BIMALLEOLAR LOWER LEG   Plan: Plan for Procedure(s): OPEN REDUCTION INTERNAL FIXATION (ORIF)  [name]  ANKLE FRACTURE  The risks benefits and alternatives were discussed with the patient including but not limited to the risks of nonoperative treatment, versus surgical intervention including infection, bleeding, nerve injury, malunion, nonunion, the need for revision surgery, hardware prominence, hardware failure, the need for hardware removal, blood clots, cardiopulmonary complications,  morbidity, mortality, among others, and they were willing to proceed.     Deborah PostJoshua P Geofrey Silliman, MD Cell [phone]   05/03/2017 9:54 AM

## 2017-05-03 NOTE — Anesthesia Procedure Notes (Signed)
Anesthesia Regional Block: Popliteal block   Pre-Anesthetic Checklist: ,, timeout performed, Correct Patient, Correct Site, Correct Laterality, Correct Procedure, Correct Position, site marked, Risks and benefits discussed,  Surgical consent,  Pre-op evaluation,  At surgeon's request and post-op pain management  Laterality: Left  Prep: chloraprep       Needles:  Injection technique: Single-shot  Needle Type: Echogenic Stimulator Needle     Needle Length: 9cm  Needle Gauge: 21   Needle insertion depth: 6 cm   Additional Needles:   Procedures:,,,, ultrasound used (permanent image in chart),,,,  Narrative:  Start time: 05/03/2017 9:43 AM End time: 05/03/2017 9:48 AM Injection made incrementally with aspirations every 5 mL.  Performed by: Personally  Anesthesiologist: Mal AmabileFoster, Teasha Murrillo, MD  Additional Notes: Timeout performed. Patient sedated. Relevant anatomy ID'd using US. Incremental 2-965ml injection of LA with frequent aspiration. Patient tolerated procedure well.

## 2017-05-03 NOTE — Anesthesia Procedure Notes (Signed)
Anesthesia Regional Block: Adductor canal block   Pre-Anesthetic Checklist: ,, timeout performed, Correct Patient, Correct Site, Correct Laterality, Correct Procedure, Correct Position, site marked, Risks and benefits discussed,  Surgical consent,  Pre-op evaluation,  At surgeon's request and post-op pain management  Laterality: Left  Prep: chloraprep       Needles:  Injection technique: Single-shot  Needle Type: Echogenic Stimulator Needle     Needle Length: 9cm  Needle Gauge: 21   Needle insertion depth: 4 cm   Additional Needles:   Procedures:,,,, ultrasound used (permanent image in chart),,,,  Narrative:  Start time: 05/03/2017 9:49 AM End time: 05/03/2017 9:54 AM Injection made incrementally with aspirations every 5 mL.  Performed by: Personally  Anesthesiologist: Mal AmabileFoster, Zenita Kister, MD  Additional Notes: Timeout performed. Patient sedated. Relevant anatomy ID'd using US. Incremental 2-735ml injection of LA with frequent aspiration. Patient tolerated procedure well.

## 2017-05-03 NOTE — Progress Notes (Signed)
Assisted Dr. Foster with left, ultrasound guided, popliteal, adductor canal block. Side rails up, monitors on throughout procedure. See vital signs in flow sheet. Tolerated Procedure well. 

## 2017-05-05 ENCOUNTER — Encounter (HOSPITAL_BASED_OUTPATIENT_CLINIC_OR_DEPARTMENT_OTHER): Payer: Self-pay | Admitting: Orthopedic Surgery

## 2021-04-15 ENCOUNTER — Other Ambulatory Visit: Payer: Self-pay | Admitting: Obstetrics and Gynecology

## 2021-04-15 DIAGNOSIS — R928 Other abnormal and inconclusive findings on diagnostic imaging of breast: Secondary | ICD-10-CM

## 2021-04-29 ENCOUNTER — Ambulatory Visit
Admission: RE | Admit: 2021-04-29 | Discharge: 2021-04-29 | Disposition: A | Discharge status: Home / Self Care | Payer: No Typology Code available for payment source | Source: Ambulatory Visit | Attending: Obstetrics and Gynecology | Admitting: Obstetrics and Gynecology

## 2021-04-29 ENCOUNTER — Ambulatory Visit
Admission: RE | Admit: 2021-04-29 | Discharge: 2021-04-29 | Disposition: A | Discharge status: Home / Self Care | Payer: Self-pay | Source: Ambulatory Visit | Attending: Obstetrics and Gynecology | Admitting: Obstetrics and Gynecology

## 2021-04-29 DIAGNOSIS — R928 Other abnormal and inconclusive findings on diagnostic imaging of breast: Secondary | ICD-10-CM

## 2021-05-07 ENCOUNTER — Other Ambulatory Visit: Payer: BLUE CROSS/BLUE SHIELD

## 2022-06-17 ENCOUNTER — Other Ambulatory Visit: Payer: Self-pay | Admitting: Internal Medicine

## 2022-06-17 DIAGNOSIS — Z1231 Encounter for screening mammogram for malignant neoplasm of breast: Secondary | ICD-10-CM

## 2022-06-22 ENCOUNTER — Ambulatory Visit
Admission: RE | Admit: 2022-06-22 | Discharge: 2022-06-22 | Disposition: A | Discharge status: Home / Self Care | Payer: Managed Care, Other (non HMO) | Source: Ambulatory Visit | Attending: Internal Medicine | Admitting: Internal Medicine

## 2022-06-22 DIAGNOSIS — Z1231 Encounter for screening mammogram for malignant neoplasm of breast: Secondary | ICD-10-CM

## 2023-01-13 IMAGING — US US BREAST*L* LIMITED INC AXILLA
1 series · 12 of 12 positions shown · non-contrast
Comparison: Previous exam(s).

CLINICAL DATA: The patient was called back for a left breast mass.

EXAM:
DIGITAL DIAGNOSTIC UNILATERAL LEFT MAMMOGRAM WITH TOMOSYNTHESIS AND
CAD; ULTRASOUND LEFT BREAST LIMITED
TECHNIQUE: Left digital diagnostic mammography and breast tomosynthesis was
performed. The images were evaluated with computer-aided detection.;
Targeted ultrasound examination of the left breast was performed.

[Series 1: us breast*left* limited inc axilla · 0.06mm/px · 12 of 12 slices shown]
[im 1/12]
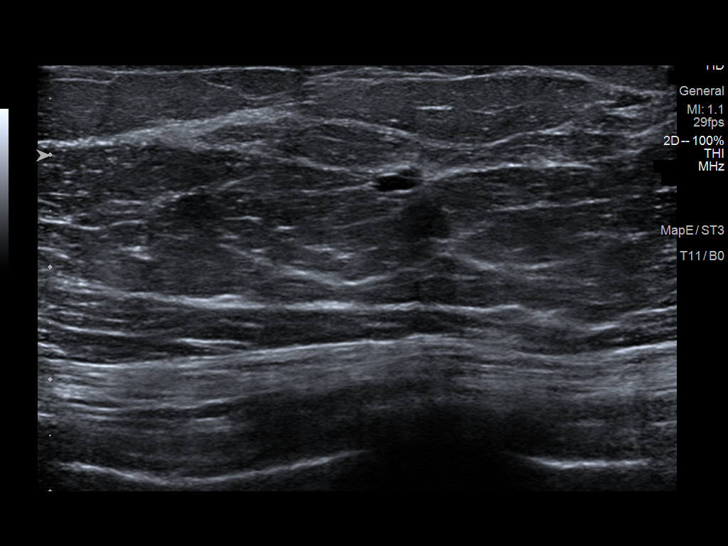
[im 2/12]
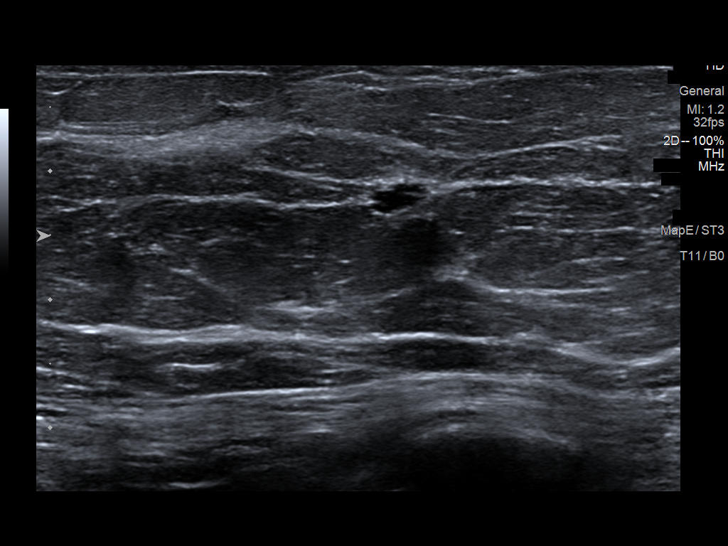
[im 3/12]
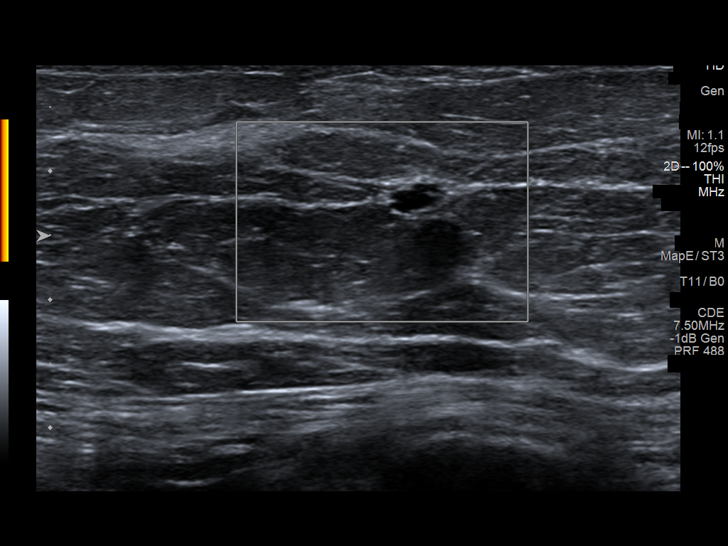
[im 4/12]
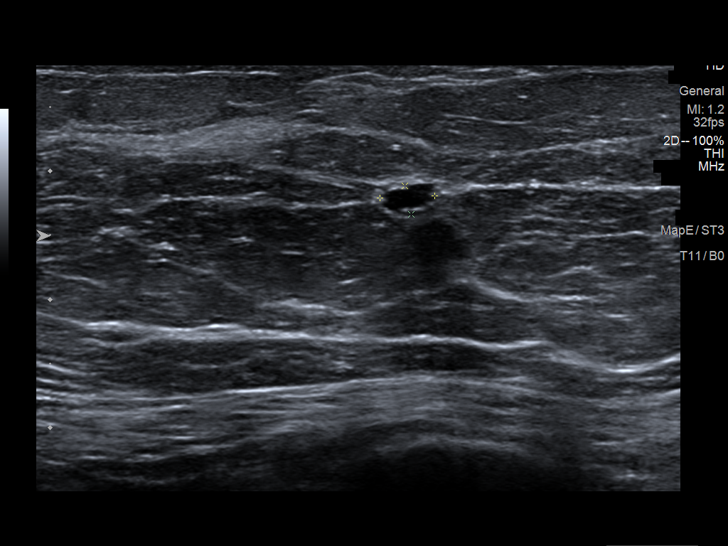
[im 5/12]
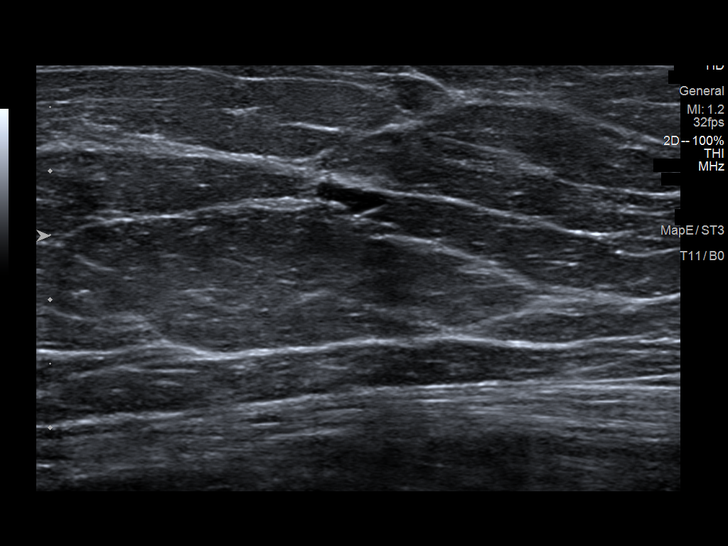
[im 6/12]
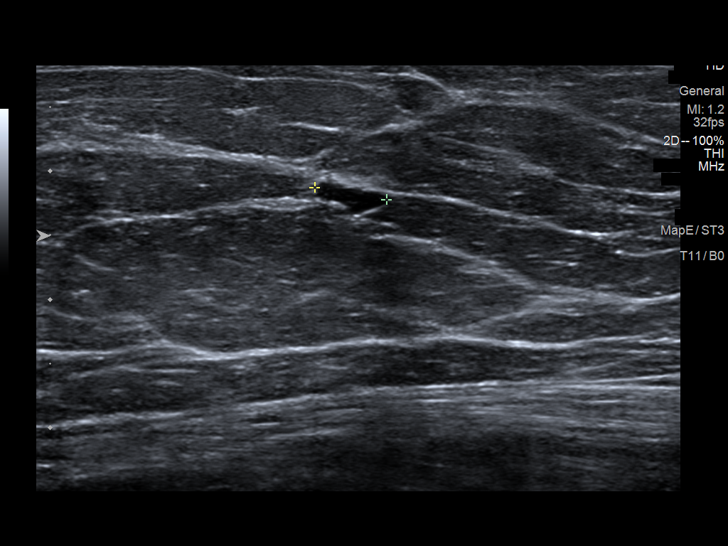
[im 7/12]
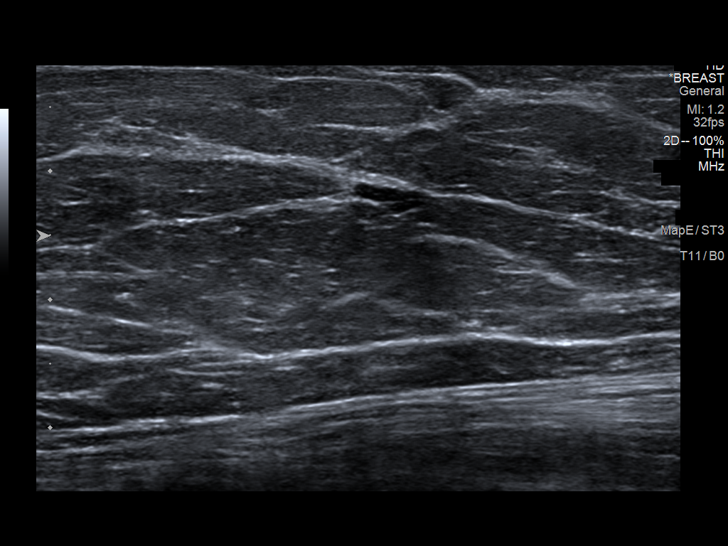
[im 8/12]
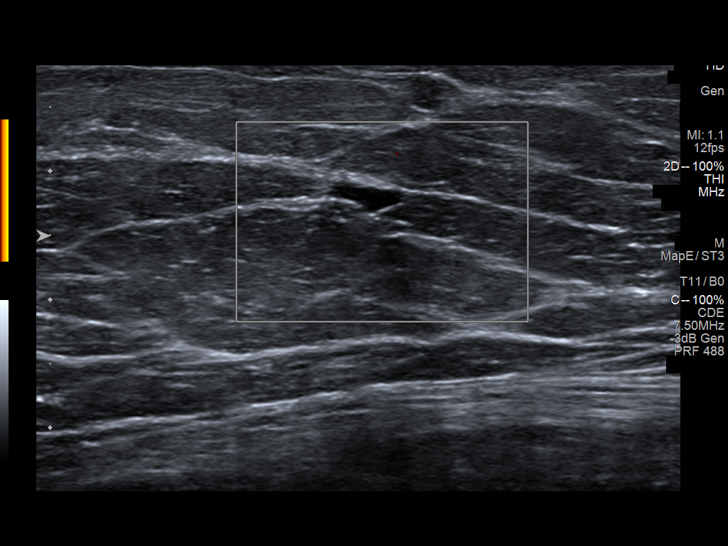
[im 9/12]
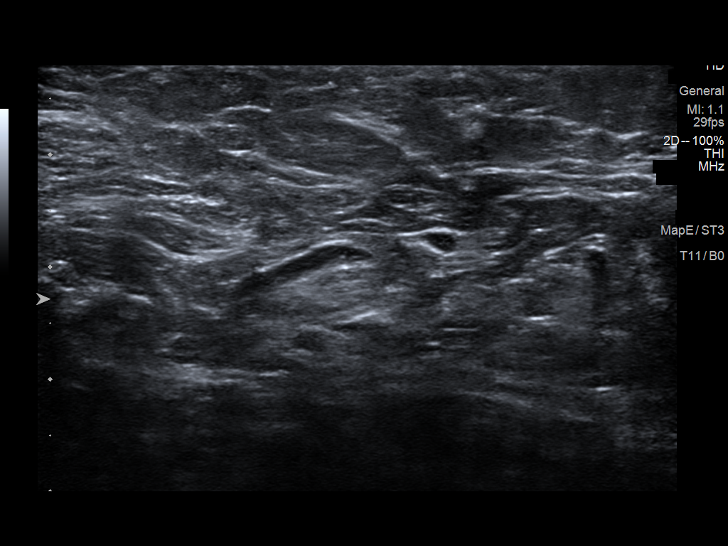
[im 10/12]
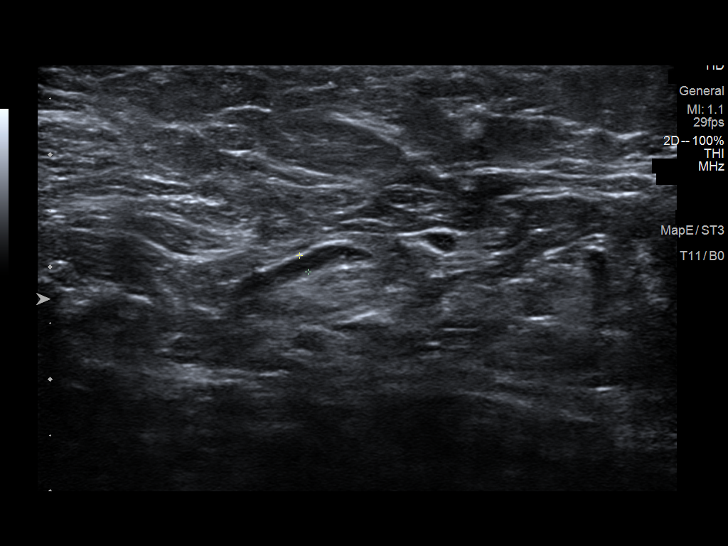
[im 11/12]
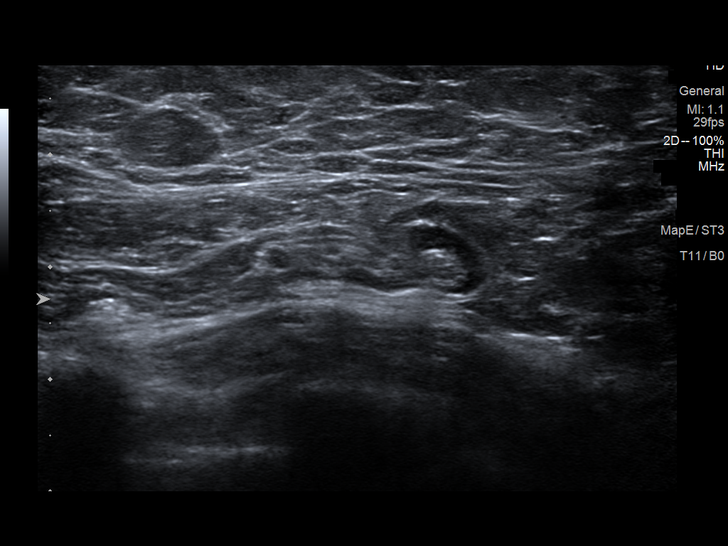
[im 12/12]
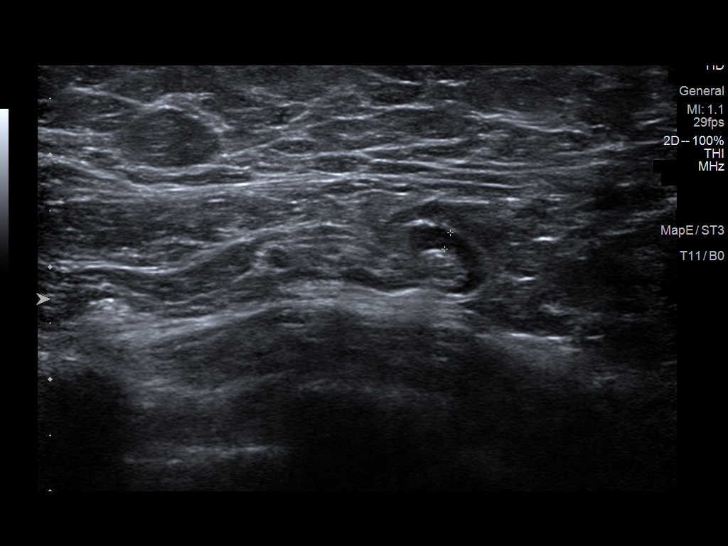

[12 of 12 positions shown; findings below may reference images not displayed]

ACR Breast Density Category b: There are scattered areas of
fibroglandular density.
FINDINGS: The mass in the upper outer left breast persists on today's imaging.

Targeted ultrasound is performed, showing a cyst in the left breast
at 2 o'clock, 5 cm from the nipple. There are 2 or 3 thin septations
but no solid components associated with this mildly complicated
cyst.
IMPRESSION: Fibrocystic changes.  No evidence of malignancy.

RECOMMENDATION:
Annual screening mammography.

I have discussed the findings and recommendations with the patient.
If applicable, a reminder letter will be sent to the patient
regarding the next appointment.

BI-RADS CATEGORY  2: Benign.

## 2023-01-13 IMAGING — MG MM DIGITAL DIAGNOSTIC UNILAT*L* W/ TOMO W/ CAD
4 series · 4 of 12 positions shown · non-contrast
Comparison: Previous exam(s).

CLINICAL DATA: The patient was called back for a left breast mass.

EXAM:
DIGITAL DIAGNOSTIC UNILATERAL LEFT MAMMOGRAM WITH TOMOSYNTHESIS AND
CAD; ULTRASOUND LEFT BREAST LIMITED
TECHNIQUE: Left digital diagnostic mammography and breast tomosynthesis was
performed. The images were evaluated with computer-aided detection.;
Targeted ultrasound examination of the left breast was performed.

[L CC synth-2D]
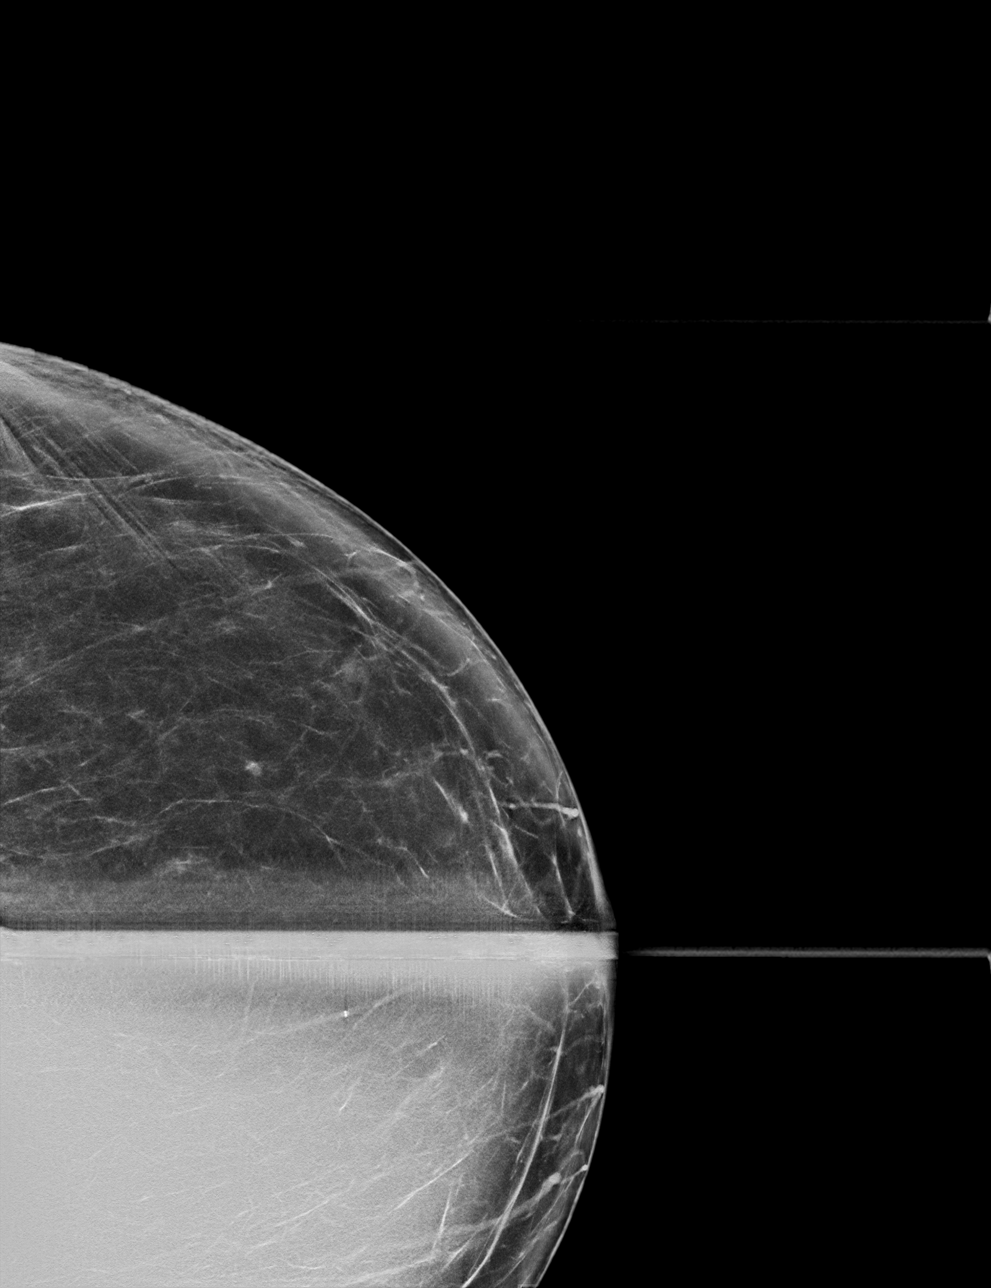

[L MLO synth-2D]
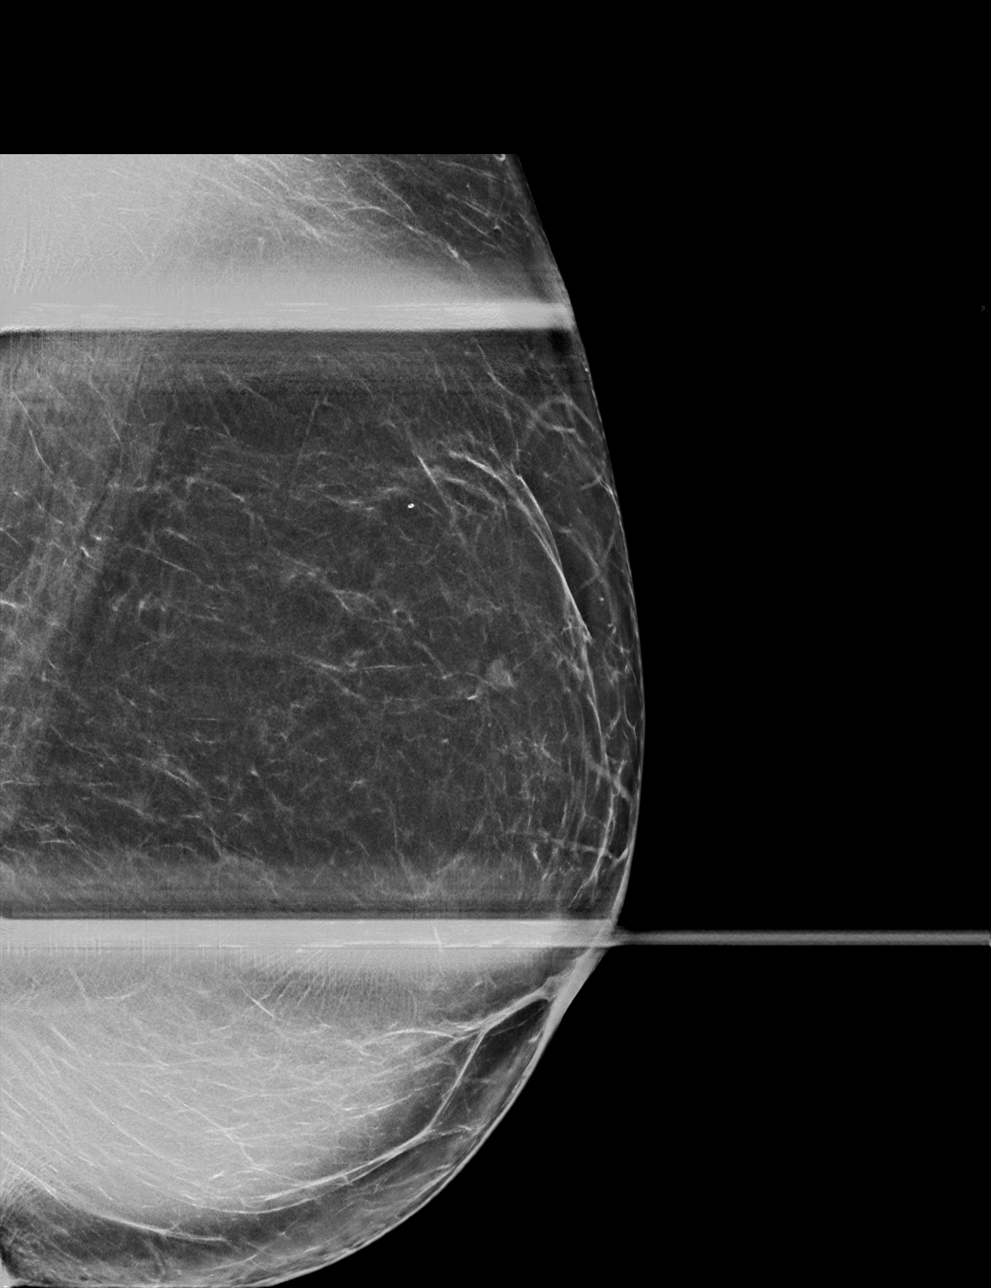

[L CC tomo · tomo slice 38/75.0]
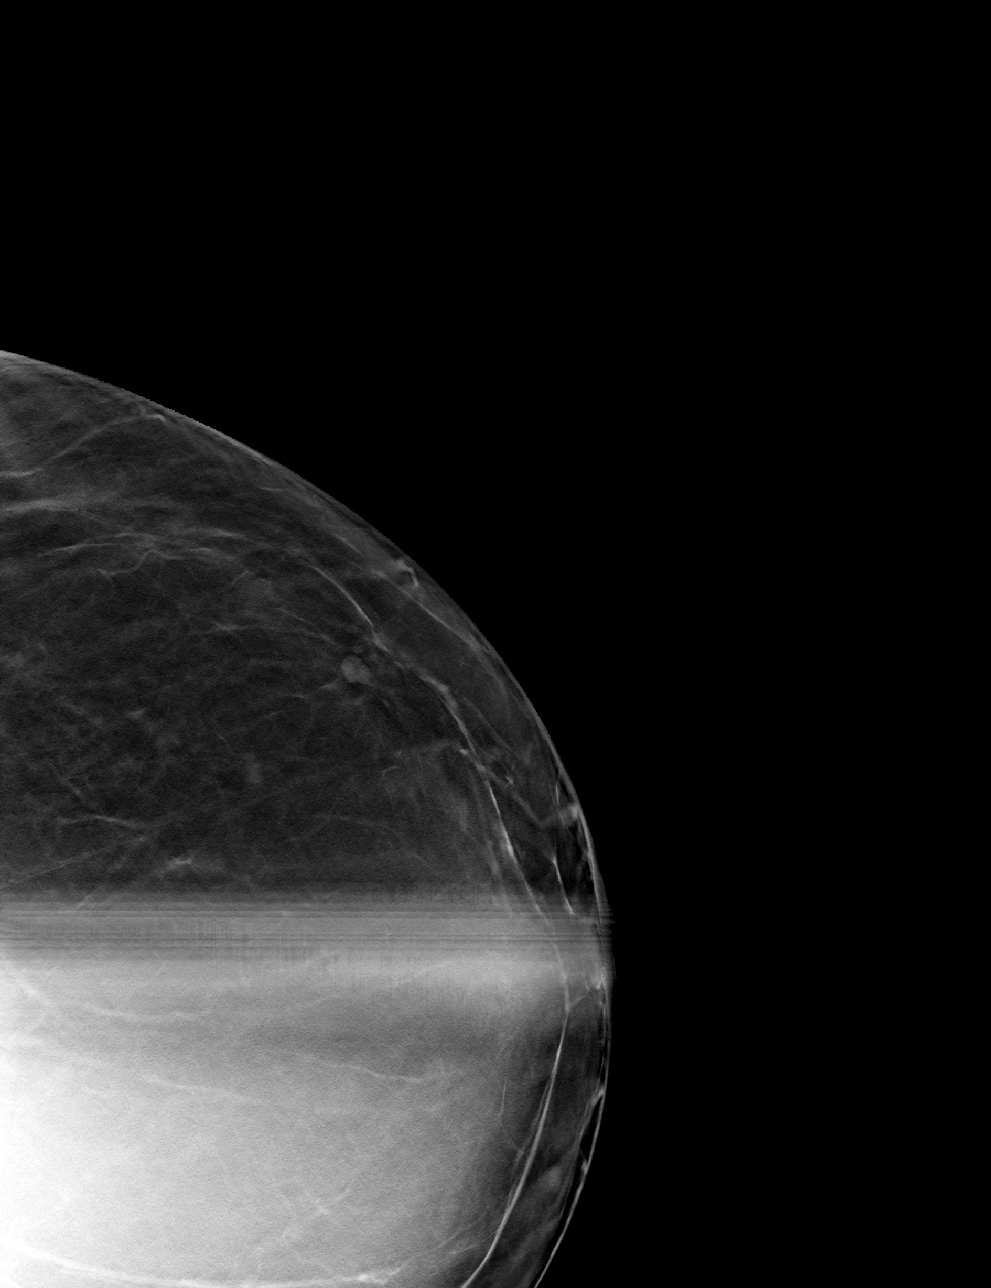

[L MLO tomo · tomo slice 37/74.0]
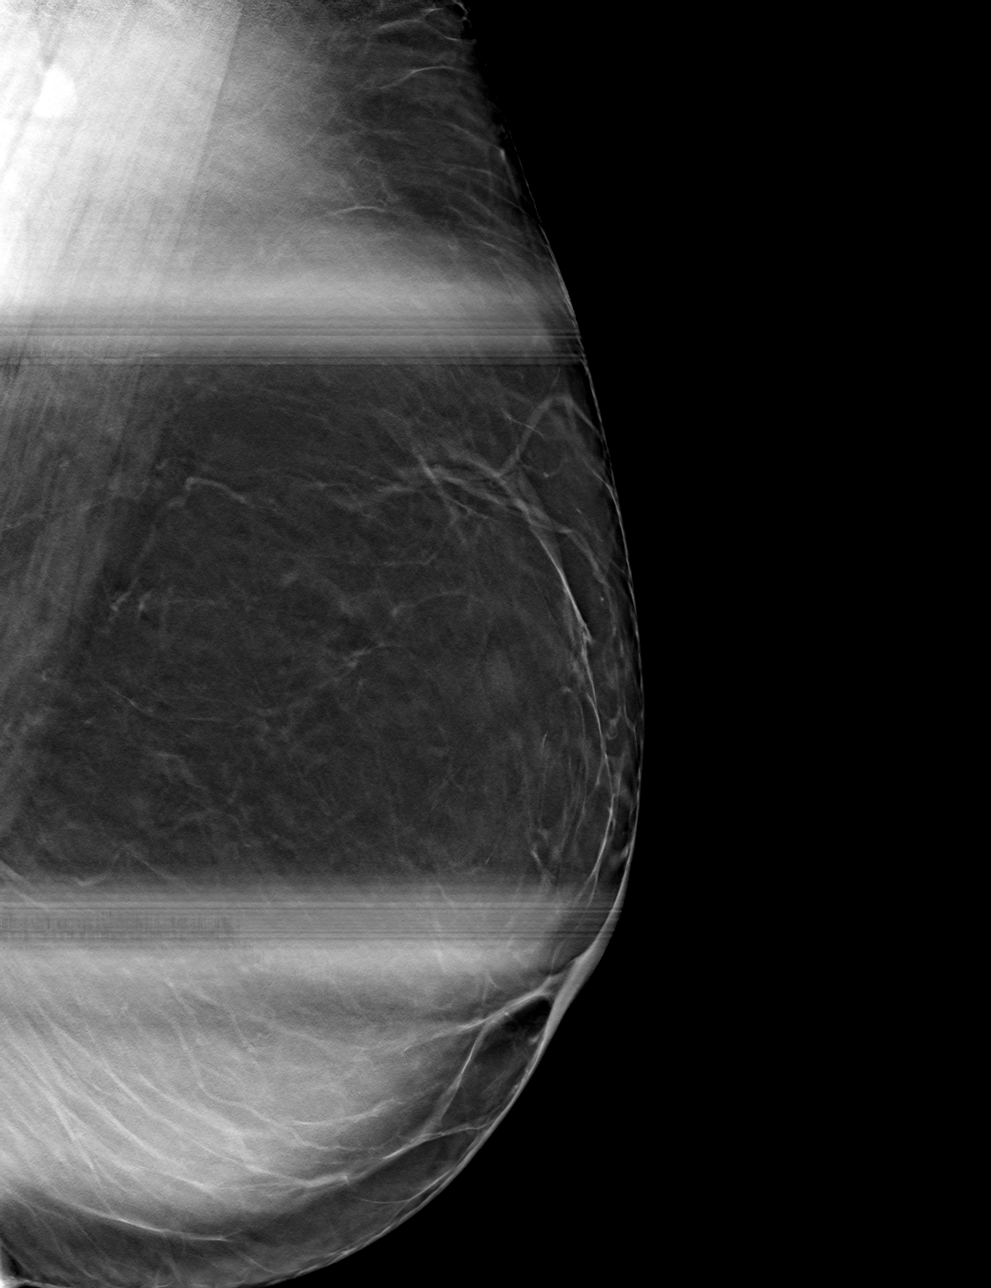

[4 of 12 positions shown; findings below may reference images not displayed]

ACR Breast Density Category b: There are scattered areas of
fibroglandular density.
FINDINGS: The mass in the upper outer left breast persists on today's imaging.

Targeted ultrasound is performed, showing a cyst in the left breast
at 2 o'clock, 5 cm from the nipple. There are 2 or 3 thin septations
but no solid components associated with this mildly complicated
cyst.
IMPRESSION: Fibrocystic changes.  No evidence of malignancy.

RECOMMENDATION:
Annual screening mammography.

I have discussed the findings and recommendations with the patient.
If applicable, a reminder letter will be sent to the patient
regarding the next appointment.

BI-RADS CATEGORY  2: Benign.

## 2023-07-27 ENCOUNTER — Other Ambulatory Visit: Payer: Self-pay | Admitting: Internal Medicine

## 2023-07-27 DIAGNOSIS — Z1231 Encounter for screening mammogram for malignant neoplasm of breast: Secondary | ICD-10-CM

## 2023-08-03 ENCOUNTER — Ambulatory Visit
Admission: RE | Admit: 2023-08-03 | Discharge: 2023-08-03 | Disposition: A | Discharge status: Home / Self Care | Source: Ambulatory Visit | Attending: Internal Medicine | Admitting: Internal Medicine

## 2023-08-03 DIAGNOSIS — Z1231 Encounter for screening mammogram for malignant neoplasm of breast: Secondary | ICD-10-CM

## 2023-11-28 DIAGNOSIS — Z6825 Body mass index (BMI) 25.0-25.9, adult: Secondary | ICD-10-CM | POA: Diagnosis not present

## 2023-11-28 DIAGNOSIS — L237 Allergic contact dermatitis due to plants, except food: Secondary | ICD-10-CM | POA: Diagnosis not present
